# Patient Record
Sex: Female | Born: 2005 | Race: White | Hispanic: No | Marital: Single
Health system: Southern US, Community
[De-identification: ages and names within clinical notes are randomized; demographics above are authoritative.]

---
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
## 2024-05-18 ENCOUNTER — Other Ambulatory Visit (HOSPITAL_BASED_OUTPATIENT_CLINIC_OR_DEPARTMENT_OTHER): Payer: Self-pay

## 2024-05-18 MED ORDER — OZEMPIC (0.25 OR 0.5 MG/DOSE) 2 MG/3ML ~~LOC~~ SOPN
0.2500 mg | PEN_INJECTOR | SUBCUTANEOUS | 0 refills | Status: DC
  Filled 2024-05-18: qty 3, 56d supply, fill #0
  Filled 2024-06-19: qty 3, 56d supply, fill #1

## 2024-05-19 ENCOUNTER — Other Ambulatory Visit (HOSPITAL_BASED_OUTPATIENT_CLINIC_OR_DEPARTMENT_OTHER): Payer: Self-pay

## 2024-06-20 ENCOUNTER — Other Ambulatory Visit (HOSPITAL_BASED_OUTPATIENT_CLINIC_OR_DEPARTMENT_OTHER): Payer: Self-pay

## 2024-06-29 ENCOUNTER — Other Ambulatory Visit (HOSPITAL_BASED_OUTPATIENT_CLINIC_OR_DEPARTMENT_OTHER): Payer: Self-pay
